# Patient Record
Sex: Male | Born: 1995 | Race: Black or African American | Hispanic: No | Marital: Single | Smoking: Current some day smoker
Health system: Southern US, Community
[De-identification: ages and names within clinical notes are randomized; demographics above are authoritative.]

---
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
## 2014-05-19 ENCOUNTER — Encounter (HOSPITAL_COMMUNITY): Payer: Self-pay | Admitting: Emergency Medicine

## 2014-05-19 ENCOUNTER — Emergency Department (INDEPENDENT_AMBULATORY_CARE_PROVIDER_SITE_OTHER)
Admission: EM | Admit: 2014-05-19 | Discharge: 2014-05-19 | Disposition: A | Discharge status: Home / Self Care | Payer: Self-pay | Source: Home / Self Care | Attending: Family Medicine | Admitting: Family Medicine

## 2014-05-19 DIAGNOSIS — S01111A Laceration without foreign body of right eyelid and periocular area, initial encounter: Secondary | ICD-10-CM

## 2014-05-19 DIAGNOSIS — S0180XA Unspecified open wound of other part of head, initial encounter: Secondary | ICD-10-CM
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
## 2014-05-19 NOTE — Discharge Instructions (Signed)
Thank you for coming in today. ° ° °Scar Minimization °You will have a scar anytime you have surgery and a cut is made in the skin or you have something removed from your skin (mole, skin cancer, cyst). Although scars are unavoidable following surgery, there are ways to minimize their appearance. °It is important to follow all the instructions you receive from your caregiver about wound care. How your wound heals will influence the appearance of your scar. If you do not follow the wound care instructions as directed, complications such as infection may occur. Wound instructions include keeping the wound clean, moist, and not letting the wound form a scab. Some people form scars that are raised and lumpy (hypertrophic) or larger than the initial wound (keloidal). °HOME CARE INSTRUCTIONS  °· Follow wound care instructions as directed. °· Keep the wound clean by washing it with soap and water. °· Keep the wound moist with provided antibiotic cream or petroleum jelly until completely healed. Moisten twice a day for about 2 weeks. °· Get stitches (sutures) taken out at the scheduled time. °· Avoid touching or manipulating your wound unless needed. Wash your hands thoroughly before and after touching your wound. °· Follow all restrictions such as limits on exercise or work. This depends on where your scar is located. °· Keep the scar protected from sunburn. Cover the scar with sunscreen/sunblock with SPF 30 or higher. °· Gently massage the scar using a circular motion to help minimize the appearance of the scar. Do this only after the wound has closed and all the sutures have been removed. °· For hypertrophic or keloidal scars, there are several ways to treat and minimize their appearance. Methods include compression therapy, intralesional corticosteroids, laser therapy, or surgery. These methods are performed by your caregiver. °Remember that the scar may appear lighter or darker than your normal skin color. This  difference in color should even out with time. °SEEK MEDICAL CARE IF:  °· You have a fever. °· You develop signs of infection such as pain, redness, pus, and warmth. °· You have questions or concerns. °Document Released: 03/09/2010 Document Revised: 12/12/2011 Document Reviewed: 03/09/2010 °ExitCare® Patient Information ©2015 [employer]. This information is not intended to replace advice given to you by your health care provider. Make sure you discuss any questions you have with your health care provider. ° °

## 2014-05-19 NOTE — ED Notes (Signed)
Pt is here to have stitches removed above right eyebrow Had them placed at another facility  Voices no new concerns Alert; no signs of acute distress.

## 2015-05-18 ENCOUNTER — Encounter (HOSPITAL_COMMUNITY): Payer: Self-pay | Admitting: Emergency Medicine

## 2015-05-18 ENCOUNTER — Emergency Department (INDEPENDENT_AMBULATORY_CARE_PROVIDER_SITE_OTHER)
Admission: EM | Admit: 2015-05-18 | Discharge: 2015-05-18 | Disposition: A | Discharge status: Home / Self Care | Payer: Self-pay | Source: Home / Self Care | Attending: Family Medicine | Admitting: Family Medicine

## 2015-05-18 DIAGNOSIS — R52 Pain, unspecified: Secondary | ICD-10-CM

## 2015-05-18 DIAGNOSIS — J3489 Other specified disorders of nose and nasal sinuses: Secondary | ICD-10-CM

## 2015-05-18 LAB — CK: Total CK: 195 U/L (ref 49–397)

## 2015-05-18 LAB — POCT I-STAT, CHEM 8
BUN: 15 mg/dL (ref 6–20)
CHLORIDE: 101 mmol/L (ref 101–111)
CREATININE: 0.9 mg/dL (ref 0.61–1.24)
Calcium, Ion: 1.25 mmol/L — ABNORMAL HIGH (ref 1.12–1.23)
GLUCOSE: 88 mg/dL (ref 65–99)
HCT: 48 % (ref 39.0–52.0)
HEMOGLOBIN: 16.3 g/dL (ref 13.0–17.0)
POTASSIUM: 3.9 mmol/L (ref 3.5–5.1)
Sodium: 139 mmol/L (ref 135–145)
TCO2: 25 mmol/L (ref 0–100)

## 2015-05-18 MED ORDER — ACETAMINOPHEN 325 MG PO TABS
ORAL_TABLET | ORAL | Status: AC
  Filled 2015-05-18: qty 3

## 2015-05-18 MED ORDER — IPRATROPIUM BROMIDE 0.06 % NA SOLN: 2.0000 | Freq: Four times a day (QID) | NASAL | Status: DC

## 2015-05-18 NOTE — ED Provider Notes (Signed)
CSN: [id number]     Arrival date & time 05/18/15  1343 History   First MD Initiated Contact with Patient 05/18/15 1550     Chief Complaint  Patient presents with  . Generalized Body Aches   (Consider location/radiation/quality/duration/timing/severity/associated sxs/prior Treatment) HPI   23:00 yesterday pt developed generalized weakness and the sensation of having a "boulder on his body." Runy nose started today. Has not taken naything for his symptoms.  No change since onset.  Nothing makes symptoms better or worse.  Pain in lower back and, R leg and shouders. Constant. Very dark urine x 24 hrs.  Works in [employer]. Patient works out for several hours every day as well. deneis cough, fever, CP, SOB, palptiations, rash.   History reviewed. No pertinent past medical history. History reviewed. No pertinent past surgical history. Family History  Problem Relation Age of Onset  . Cancer Other   . Diabetes Other   . Heart failure Other   . Hyperlipidemia Other    Social History  Substance Use Topics  . Smoking status: Never Smoker   . Smokeless tobacco: None  . Alcohol Use: No    Review of Systems Per HPI with all other pertinent systems negative.   Allergies  Review of patient's allergies indicates no known allergies.  Home Medications   Prior to Admission medications   Not on File   BP 137/76 mmHg  Pulse 67  Temp(Src) 97.9 F (36.6 C) (Oral)  Resp 16  SpO2 100% Physical Exam Physical Exam  Constitutional: oriented to person, place, and time. appears well-developed and well-nourished. No distress.  HENT:  Head: Normocephalic and atraumatic.  Eyes: EOMI. PERRL.  Neck: Normal range of motion.  Cardiovascular: RRR, no m/r/g, 2+ distal pulses,  Pulmonary/Chest: Effort normal and breath sounds normal. No respiratory distress.  Abdominal: Soft. Bowel sounds are normal. NonTTP, no distension.  Musculoskeletal: Normal range of motion. Non ttp, no effusion.   Neurological: alert and oriented to person, place, and time.  Skin: Skin is warm. No rash noted. non diaphoretic.  Psychiatric: normal mood and affect. behavior is normal. Judgment and thought content normal.   ED Course  Procedures (including critical care time) Labs Review Labs Reviewed  POCT I-STAT, CHEM 8 - Abnormal; Notable for the following:    Calcium, Ion 1.25 (*)    All other components within normal limits  CK    Imaging Review No results found.   MDM  No diagnosis found.  CHem 8 w/ NMl Cr. CK 195. Low concern for rhabdo at this point time. Likely viral etiology. Patient to stay well-hydrated and get plain rest. Tylenol and ibuprofen as needed for pain relief. Due to the ED if gets worse. Gatorade adn tylenol  po in office.   Ozella Rocks, MD 05/18/15 [phone]

## 2015-05-18 NOTE — ED Notes (Signed)
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 No signs of acute distress.

## 2015-05-18 NOTE — ED Notes (Signed)
Call back number verified.  

## 2015-05-18 NOTE — Discharge Instructions (Signed)
At this point time it appears that your symptoms are primarily due to a viral illness compounded by overexertion and dehydration. Please stay well-hydrated and get plenty of rest over the next couple of days. Your condition may continue to worsen for the next 1-3 days before improving. We have sent out additional lab work and we'll call you if it is abnormal at which point you'll have to go to the emergency room for more treatment. Please skip the rest but remain active and stay well-hydrated.

## 2017-05-08 ENCOUNTER — Ambulatory Visit (HOSPITAL_COMMUNITY)
Admission: EM | Admit: 2017-05-08 | Discharge: 2017-05-08 | Disposition: A | Discharge status: Home / Self Care | Payer: Self-pay | Attending: Internal Medicine | Admitting: Internal Medicine

## 2017-05-08 ENCOUNTER — Ambulatory Visit (INDEPENDENT_AMBULATORY_CARE_PROVIDER_SITE_OTHER): Payer: Self-pay

## 2017-05-08 ENCOUNTER — Encounter (HOSPITAL_COMMUNITY): Payer: Self-pay | Admitting: Nurse Practitioner

## 2017-05-08 DIAGNOSIS — M659 Synovitis and tenosynovitis, unspecified: Secondary | ICD-10-CM

## 2017-05-08 MED ORDER — MELOXICAM 15 MG PO TABS: 15.0000 mg | ORAL_TABLET | Freq: Every day | ORAL | 2 refills | Status: DC

## 2017-05-08 NOTE — ED Triage Notes (Addendum)
Pt presents with left wrist pain. The pain began about three weeks ago and has been increasingly worse since onset. The pain is present with movement and absent at rest. 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 He has tried ice and wrist splint with some relief.

## 2017-05-08 NOTE — ED Provider Notes (Signed)
  [facility]   [id number] 05/08/17 Arrival Time: 1314  ASSESSMENT & PLAN:  1. Tenosynovitis, wrist     Meds ordered this encounter  Medications  . meloxicam (MOBIC) 15 MG tablet    Sig: Take 1 tablet (15 mg total) by mouth daily.    Dispense:  30 tablet    Refill:  2    Order Specific Question:   Supervising Provider    Answer:   Eustace MooreMURRAY, LAURA W [[id number]]    Reviewed expectations re: course of current medical issues. Questions answered. Outlined signs and symptoms indicating need for more acute intervention. Patient verbalized understanding. After Visit Summary given.   SUBJECTIVE:  Cody Potter is a 21 y.o. male who presents with complaint of Left wrist pain. He has no traumatic history, he has not fallen, has not had any MVC's. 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 No fever or chills or swelling of the joint  ROS: As per HPI, remainder of ROS negative.   OBJECTIVE:  Vitals:   05/08/17 1406  BP: 124/71  Pulse: 84  Resp: 16  Temp: 98.4 F (36.9 C)  TempSrc: Oral  SpO2: 99%     General appearance: alert; no distress HEENT: normocephalic; atraumatic; conjunctivae normal;  Lungs: clear to auscultation bilaterally Heart: regular rate and rhythm Abdomen: soft, non-tender; bowel sounds normal; no masses or organomegaly; no guarding or rebound tenderness Back: no CVA tenderness Extremities: no cyanosis or edema; symmetrical with no gross Deformities, left hand no sensory deficit at the ulnar, radial, or medial nerve, capillary refills less than 2 seconds, able to flex and extend the fingers, full grip strength, there is tenderness at the distal head of the ulna, negative Finkelstein.  Skin: warm and dry Neurologic: Grossly normal Psychological:  alert and cooperative; normal mood and  affect  Procedures:     Labs Reviewed - No data to display  Dg Wrist Complete Left  Result Date: 05/08/2017 CLINICAL DATA:  21 y/o  M; three weeks of left wrist pain. EXAM: LEFT WRIST - COMPLETE 3+ VIEW COMPARISON:  None. FINDINGS: There is no evidence of fracture or dislocation. There is no evidence of arthropathy or other focal bone abnormality. Soft tissues are unremarkable. IMPRESSION: Negative. Electronically Signed   By: Mitzi HansenLance  Furusawa-Stratton M.D.   On: 05/08/2017 15:00    No Known Allergies  PMHx, SurgHx, SocialHx, Medications, and Allergies were reviewed in the Visit Navigator and updated as appropriate.       Dorena BodoKennard, Simya Tercero, NP 05/08/17 [phone]

## 2017-05-08 NOTE — Discharge Instructions (Signed)
Rest, ice 15 minutes at a time for more times a day, meloxicam once daily for pain, do not take any Motrin, ibuprofen, naproxen with this medication, but you may take Tylenol with it. If pain persists past one month, follow-up with an orthopedist

## 2017-05-28 ENCOUNTER — Encounter (HOSPITAL_COMMUNITY): Payer: Self-pay | Admitting: Emergency Medicine

## 2017-05-28 DIAGNOSIS — Y998 Other external cause status: Secondary | ICD-10-CM | POA: Insufficient documentation

## 2017-05-28 DIAGNOSIS — S161XXA Strain of muscle, fascia and tendon at neck level, initial encounter: Secondary | ICD-10-CM | POA: Insufficient documentation

## 2017-05-28 DIAGNOSIS — Y929 Unspecified place or not applicable: Secondary | ICD-10-CM | POA: Insufficient documentation

## 2017-05-28 DIAGNOSIS — Y9389 Activity, other specified: Secondary | ICD-10-CM | POA: Insufficient documentation
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
## 2017-05-29 ENCOUNTER — Emergency Department (HOSPITAL_COMMUNITY)
Admission: EM | Admit: 2017-05-29 | Discharge: 2017-05-29 | Disposition: A | Discharge status: Home / Self Care | Payer: Self-pay | Attending: Emergency Medicine | Admitting: Emergency Medicine

## 2017-05-29 ENCOUNTER — Emergency Department (HOSPITAL_COMMUNITY): Payer: Self-pay

## 2017-05-29 DIAGNOSIS — S161XXA Strain of muscle, fascia and tendon at neck level, initial encounter: Secondary | ICD-10-CM

## 2017-05-29 MED ORDER — ACETAMINOPHEN 325 MG PO TABS
650.0000 mg | ORAL_TABLET | Freq: Once | ORAL | Status: AC
  Administered 2017-05-29: 650 mg via ORAL
  Filled 2017-05-29: qty 2

## 2017-05-29 MED ORDER — METHOCARBAMOL 500 MG PO TABS: 500.0000 mg | ORAL_TABLET | Freq: Three times a day (TID) | ORAL | 0 refills | Status: DC | PRN

## 2017-05-29 MED ORDER — IBUPROFEN 200 MG PO TABS
600.0000 mg | ORAL_TABLET | Freq: Once | ORAL | Status: AC
  Administered 2017-05-29: 600 mg via ORAL
  Filled 2017-05-29: qty 3

## 2017-05-29 MED ORDER — CYCLOBENZAPRINE HCL 10 MG PO TABS
5.0000 mg | ORAL_TABLET | Freq: Once | ORAL | Status: AC
  Administered 2017-05-29: 5 mg via ORAL
  Filled 2017-05-29: qty 1

## 2017-05-29 MED ORDER — IBUPROFEN 600 MG PO TABS: 600.0000 mg | ORAL_TABLET | Freq: Three times a day (TID) | ORAL | 0 refills | Status: DC | PRN
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
## 2017-06-06 ENCOUNTER — Ambulatory Visit (HOSPITAL_COMMUNITY)
Admission: EM | Admit: 2017-06-06 | Discharge: 2017-06-06 | Disposition: A | Discharge status: Home / Self Care | Payer: Self-pay | Attending: Family Medicine | Admitting: Family Medicine

## 2017-06-06 ENCOUNTER — Encounter (HOSPITAL_COMMUNITY): Payer: Self-pay | Admitting: Emergency Medicine

## 2017-06-06 DIAGNOSIS — S161XXA Strain of muscle, fascia and tendon at neck level, initial encounter: Secondary | ICD-10-CM

## 2017-06-06 MED ORDER — PREDNISONE 10 MG (21) PO TBPK: ORAL_TABLET | ORAL | 0 refills | Status: DC
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
## 2017-06-13 ENCOUNTER — Encounter (HOSPITAL_COMMUNITY): Payer: Self-pay | Admitting: Emergency Medicine

## 2017-06-13 ENCOUNTER — Ambulatory Visit (HOSPITAL_COMMUNITY)
Admission: EM | Admit: 2017-06-13 | Discharge: 2017-06-13 | Disposition: A | Discharge status: Home / Self Care | Payer: Self-pay | Attending: Family Medicine | Admitting: Family Medicine

## 2017-06-13 DIAGNOSIS — S161XXD Strain of muscle, fascia and tendon at neck level, subsequent encounter: Secondary | ICD-10-CM

## 2017-06-13 MED ORDER — PREDNISONE 20 MG PO TABS: ORAL_TABLET | ORAL | 0 refills | Status: DC

## 2017-06-13 MED ORDER — METHOCARBAMOL 500 MG PO TABS: 500.0000 mg | ORAL_TABLET | Freq: Three times a day (TID) | ORAL | 0 refills | Status: DC | PRN
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
## 2017-06-13 NOTE — Discharge Instructions (Signed)
If your pain persists, you will need a referral for physical therapy

## 2017-08-30 ENCOUNTER — Ambulatory Visit (HOSPITAL_COMMUNITY)
Admission: EM | Admit: 2017-08-30 | Discharge: 2017-08-30 | Disposition: A | Discharge status: Home / Self Care | Payer: Self-pay | Attending: Family Medicine | Admitting: Family Medicine

## 2017-08-30 ENCOUNTER — Other Ambulatory Visit: Payer: Self-pay

## 2017-08-30 ENCOUNTER — Encounter (HOSPITAL_COMMUNITY): Payer: Self-pay | Admitting: Emergency Medicine

## 2017-08-30 DIAGNOSIS — J069 Acute upper respiratory infection, unspecified: Secondary | ICD-10-CM

## 2017-08-30 NOTE — ED Triage Notes (Signed)
Onset yesterday morning.  Headache, nauseated.  Reports coughing up streaks of blood in phlegm.  Patient has an itchy sore throat and cough

## 2017-08-30 NOTE — Discharge Instructions (Signed)
Push fluids to ensure adequate hydration and keep secretions thin.  Tylenol and/or ibuprofen as needed for pain or fevers.  Over the counter medications as needed for symptoms, cough syrups, throat lozenges, mucinex, etc.  If symptoms worsen or do not improve in the next week to return to be seen or to follow up with PCP.

## 2017-08-30 NOTE — ED Provider Notes (Signed)
[facility]    CSN: [id number] Arrival date & time: 08/30/17  1224     History   Chief Complaint Chief Complaint  Patient presents with  . URI    HPI Cody Potter is a 21 y.o. male.   Cody Potter presents with complaints of headache, sore throat, cough which started suddenly yesterday. States yesterday he vomited multiple times and noted blood to his cough production yesterday. He states he generally feels improved today, without vomiting or blood today. Without fevers. Has been drinking liquids and urinating. Decreased food intake. Without diarrhea, denies abdominal pain, back pain. No known ill contacts. Has not taken any medications for his symptoms. Had to leave work early yesterday.    ROS per HPI.       History reviewed. No pertinent past medical history.  There are no active problems to display for this patient.   History reviewed. No pertinent surgical history.     Home Medications    Prior to Admission medications   Not on File    Family History Family History  Problem Relation Age of Onset  . Cancer Other   . Diabetes Other   . Heart failure Other   . Hyperlipidemia Other     Social History Social History   Tobacco Use  . Smoking status: Never Smoker  . Smokeless tobacco: Never Used  Substance Use Topics  . Alcohol use: No  . Drug use: No     Allergies   Patient has no known allergies.   Review of Systems Review of Systems   Physical Exam Triage Vital Signs ED Triage Vitals  Enc Vitals Group     BP 08/30/17 1239 126/73     Pulse Rate 08/30/17 1239 75     Resp 08/30/17 1239 18     Temp 08/30/17 1239 98 F (36.7 C)     Temp Source 08/30/17 1239 Oral     SpO2 08/30/17 1239 98 %     Weight --      Height --      Head Circumference --      Peak Flow --      Pain Score 08/30/17 1237 4     Pain Loc --      Pain Edu? --      Excl. in GC? --    No data found.  Updated Vital Signs BP 126/73 (BP Location: Left  Arm)   Pulse 75   Temp 98 F (36.7 C) (Oral)   Resp 18   SpO2 98%   Visual Acuity Right Eye Distance:   Left Eye Distance:   Bilateral Distance:    Right Eye Near:   Left Eye Near:    Bilateral Near:     Physical Exam  Constitutional: He is oriented to person, place, and time. He appears well-developed and well-nourished.  HENT:  Head: Normocephalic and atraumatic. Head is without raccoon's eyes.  Right Ear: Tympanic membrane, external ear and ear canal normal.  Left Ear: Tympanic membrane, external ear and ear canal normal.  Nose: Nose normal. Right sinus exhibits no maxillary sinus tenderness and no frontal sinus tenderness. Left sinus exhibits no maxillary sinus tenderness and no frontal sinus tenderness.  Mouth/Throat: Uvula is midline and mucous membranes are normal. Posterior oropharyngeal erythema present.  Eyes: Conjunctivae are normal. Pupils are equal, round, and reactive to light.  Neck: Normal range of motion.  Cardiovascular: Normal rate and regular rhythm.  Pulmonary/Chest: Effort normal and breath sounds normal.  Abdominal: Soft. There is no tenderness.  Lymphadenopathy:    He has no cervical adenopathy.  Neurological: He is alert and oriented to person, place, and time.  Skin: Skin is warm and dry.  Vitals reviewed.    UC Treatments / Results  Labs (all labs ordered are listed, but only abnormal results are displayed) Labs Reviewed - No data to display  EKG  EKG Interpretation None       Radiology No results found.  Procedures Procedures (including critical care time)  Medications Ordered in UC Medications - No data to display   Initial Impression / Assessment and Plan / UC Course  I have reviewed the triage vital signs and the nursing notes.  Pertinent labs & imaging results that were available during my care of the patient were reviewed by me and considered in my medical decision making (see chart for details).     Benign physical  findings on exam. Non toxic non distressed in appearance, vitals stable at this time. Symptoms generally improving today. Likely viral in nature. Continue with supportive cares. Tylenol and/or ibuprofen as needed for pain or fevers. Push fluids to ensure adequate hydration and keep secretions thin. Push fluids to ensure adequate hydration and keep secretions thin. Patient verbalized understanding and agreeable to plan.    Final Clinical Impressions(s) / UC Diagnoses   Final diagnoses:  Upper respiratory tract infection, unspecified type    ED Discharge Orders    None       Controlled Substance Prescriptions [place] Controlled Substance Registry consulted? Not Applicable   Georgetta HaberBurky, Natalie B, NP 08/30/17 1305

## 2018-02-12 ENCOUNTER — Ambulatory Visit (HOSPITAL_COMMUNITY)
Admission: EM | Admit: 2018-02-12 | Discharge: 2018-02-12 | Disposition: A | Discharge status: Home / Self Care | Payer: Self-pay

## 2018-03-12 ENCOUNTER — Ambulatory Visit (HOSPITAL_COMMUNITY)
Admission: EM | Admit: 2018-03-12 | Discharge: 2018-03-12 | Disposition: A | Discharge status: Home / Self Care | Payer: Self-pay | Attending: Family Medicine | Admitting: Family Medicine

## 2018-03-12 ENCOUNTER — Encounter (HOSPITAL_COMMUNITY): Payer: Self-pay | Admitting: Emergency Medicine

## 2018-03-12 DIAGNOSIS — H5713 Ocular pain, bilateral: Secondary | ICD-10-CM

## 2018-03-12 DIAGNOSIS — H16133 Photokeratitis, bilateral: Secondary | ICD-10-CM

## 2018-03-12 MED ORDER — SULFACETAMIDE-PREDNISOLONE 10-0.2 % OP OINT: 1.0000 "application " | TOPICAL_OINTMENT | Freq: Four times a day (QID) | OPHTHALMIC | 0 refills | Status: DC

## 2018-03-12 MED ORDER — OXYCODONE-ACETAMINOPHEN 5-325 MG PO TABS: 2.0000 | ORAL_TABLET | ORAL | 0 refills | Status: DC | PRN

## 2018-03-12 MED ORDER — TETRACAINE HCL 0.5 % OP SOLN
2.0000 [drp] | Freq: Once | OPHTHALMIC | Status: AC
  Administered 2018-03-12: 2 [drp] via OPHTHALMIC

## 2018-03-12 NOTE — Discharge Instructions (Addendum)
Use the eye ointment as directed Take the pain medicine as needed Get your eyes checked again if not better in 2 days Off work 2 days
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
## 2018-03-12 NOTE — ED Triage Notes (Signed)
Pt c/o bilateral redness, drainage, burning in bilateral eyes.
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
## 2018-04-17 ENCOUNTER — Other Ambulatory Visit: Payer: Self-pay

## 2018-04-17 ENCOUNTER — Encounter (HOSPITAL_COMMUNITY): Payer: Self-pay

## 2018-04-17 ENCOUNTER — Ambulatory Visit (HOSPITAL_COMMUNITY)
Admission: EM | Admit: 2018-04-17 | Discharge: 2018-04-17 | Disposition: A | Discharge status: Home / Self Care | Payer: Self-pay | Attending: Family Medicine | Admitting: Family Medicine

## 2018-04-17 DIAGNOSIS — R197 Diarrhea, unspecified: Secondary | ICD-10-CM

## 2018-04-17 DIAGNOSIS — R112 Nausea with vomiting, unspecified: Secondary | ICD-10-CM

## 2018-04-17 MED ORDER — DICYCLOMINE HCL 20 MG PO TABS: 20.0000 mg | ORAL_TABLET | Freq: Two times a day (BID) | ORAL | 0 refills | Status: DC

## 2018-04-17 MED ORDER — ONDANSETRON 4 MG PO TBDP: 4.0000 mg | ORAL_TABLET | Freq: Three times a day (TID) | ORAL | 0 refills | Status: DC | PRN

## 2018-04-17 NOTE — ED Provider Notes (Signed)
[facility]    CSN: [id number] Arrival date & time: 04/17/18  1726     History   Chief Complaint Chief Complaint  Patient presents with  . Poisoning    HPI Cody Potter is a 22 y.o. male.   22 year old male comes in for 2-day history of nausea, vomiting, diarrhea.  States possibly ate something at work that caused the symptoms.  States yesterday had 10+ episodes of nonbilious nonbloody vomit, and very frequent episodes of diarrhea.  Denies hematochezia, melena.  States has had abdominal cramping that seems to move in location, and sometimes generalized.  Mild improvement after bowel movement, no changes after vomiting.  Denies fever, chills, night sweats.  Denies URI symptoms such as cough, congestion, sore throat.  Denies recent antibiotic use, travel.  States symptoms has been slowing down today, has had 5-6 episodes of diarrhea, and 6+ episodes of vomiting.  Has been able to keep fluids down sometimes, but has not tried food.  He has been taking Tylenol for his symptoms.     History reviewed. No pertinent past medical history.  There are no active problems to display for this patient.   History reviewed. No pertinent surgical history.     Home Medications    Prior to Admission medications   Medication Sig Start Date End Date Taking? Authorizing Provider  dicyclomine (BENTYL) 20 MG tablet Take 1 tablet (20 mg total) by mouth 2 (two) times daily. 04/17/18   Cathie HoopsYu, Stina Gane V, PA-C  ondansetron (ZOFRAN ODT) 4 MG disintegrating tablet Take 1 tablet (4 mg total) by mouth every 8 (eight) hours as needed for nausea or vomiting. 04/17/18   Cathie HoopsYu, Leonor Darnell V, PA-C  oxyCODONE-acetaminophen (PERCOCET/ROXICET) 5-325 MG tablet Take 2 tablets by mouth every 4 (four) hours as needed for severe pain. 03/12/18   Eustace MooreNelson, Yvonne Sue, MD  sulfacetaminde-prednisoLONE [facility] ophthalmic ointment Place 1 application into both eyes 4 (four) times daily. 03/12/18   Eustace MooreNelson, Yvonne Sue, MD     Family History Family History  Problem Relation Age of Onset  . Cancer Other   . Diabetes Other   . Heart failure Other   . Hyperlipidemia Other     Social History Social History   Tobacco Use  . Smoking status: Never Smoker  . Smokeless tobacco: Never Used  Substance Use Topics  . Alcohol use: No  . Drug use: No     Allergies   Patient has no known allergies.   Review of Systems Review of Systems  Reason unable to perform ROS: See HPI as above.     Physical Exam Triage Vital Signs ED Triage Vitals  Enc Vitals Group     BP 04/17/18 1736 128/78     Pulse Rate 04/17/18 1736 76     Resp 04/17/18 1736 16     Temp 04/17/18 1736 97.9 F (36.6 C)     Temp Source 04/17/18 1736 Oral     SpO2 04/17/18 1736 97 %     Weight --      Height --      Head Circumference --      Peak Flow --      Pain Score 04/17/18 1739 5     Pain Loc --      Pain Edu? --      Excl. in GC? --    No data found.  Updated Vital Signs BP 128/78 (BP Location: Left Arm)   Pulse 76   Temp 97.9 F (  36.6 C) (Oral)   Resp 16   SpO2 97%   Physical Exam  Constitutional: He is oriented to person, place, and time. He appears well-developed and well-nourished. No distress.  HENT:  Head: Normocephalic and atraumatic.  Cardiovascular: Normal rate, regular rhythm and normal heart sounds. Exam reveals no gallop and no friction rub.  No murmur heard. Pulmonary/Chest: Effort normal and breath sounds normal. He has no wheezes. He has no rales.  Abdominal: Soft. He exhibits no mass. Bowel sounds are increased. There is no tenderness. There is no rigidity, no rebound, no guarding and no CVA tenderness.  Neurological: He is alert and oriented to person, place, and time.  Skin: Skin is warm and dry.  Psychiatric: He has a normal mood and affect. His behavior is normal. Judgment normal.   UC Treatments / Results  Labs (all labs ordered are listed, but only abnormal results are displayed) Labs  Reviewed - No data to display  EKG None  Radiology No results found.  Procedures Procedures (including critical care time)  Medications Ordered in UC Medications - No data to display  Initial Impression / Assessment and Plan / UC Course  I have reviewed the triage vital signs and the nursing notes.  Pertinent labs & imaging results that were available during my care of the patient were reviewed by me and considered in my medical decision making (see chart for details).    Discussed with patient no alarming signs on exam. Zofran for nausea. Bentyl for abdominal cramping. Push fluids. Bland diet, advance as tolerated. Return precautions given.  Final Clinical Impressions(s) / UC Diagnoses   Final diagnoses:  Nausea vomiting and diarrhea    ED Prescriptions    Medication Sig Dispense Auth. Provider   ondansetron (ZOFRAN ODT) 4 MG disintegrating tablet Take 1 tablet (4 mg total) by mouth every 8 (eight) hours as needed for nausea or vomiting. 20 tablet Noreene Boreman V, PA-C   dicyclomine (BENTYL) 20 MG tablet Take 1 tablet (20 mg total) by mouth 2 (two) times daily. 20 tablet Threasa Alpha, [place] 04/17/18 [phone]

## 2018-04-17 NOTE — ED Triage Notes (Signed)
Pt presents to [facility] for possible food poisoning since yesterday, pt complains of diarrhea, vomiting, and fatigue. Pt has taken Tylenol to treat symptoms

## 2018-04-17 NOTE — ED Notes (Signed)
Pt discharged by provider.

## 2018-04-17 NOTE — Discharge Instructions (Signed)
Zofran for nausea and vomiting as needed. Bentyl for stomach cramping. Keep hydrated, you urine should be clear to pale yellow in color. Bland diet, advance as tolerated. Monitor for any worsening of symptoms, nausea or vomiting not controlled by medication, worsening abdominal pain, fever, follow-up for reevaluation. ° °

## 2019-07-30 ENCOUNTER — Encounter (HOSPITAL_COMMUNITY): Payer: Self-pay

## 2019-07-30 ENCOUNTER — Other Ambulatory Visit: Payer: Self-pay

## 2019-07-30 ENCOUNTER — Ambulatory Visit (HOSPITAL_COMMUNITY)
Admission: EM | Admit: 2019-07-30 | Discharge: 2019-07-30 | Disposition: A | Discharge status: Home / Self Care | Payer: Self-pay | Attending: Emergency Medicine | Admitting: Emergency Medicine

## 2019-07-30 DIAGNOSIS — K529 Noninfective gastroenteritis and colitis, unspecified: Secondary | ICD-10-CM

## 2019-07-30 MED ORDER — ONDANSETRON 4 MG PO TBDP: 4.0000 mg | ORAL_TABLET | Freq: Three times a day (TID) | ORAL | 0 refills | Status: DC | PRN

## 2019-07-30 NOTE — Discharge Instructions (Signed)
Take the prescribed Zofran as needed for nausea/vomiting.    Rest and drink clear liquids.    Return here or follow-up with your primary care provider if you are unable to stay hydrated with clear liquids.

## 2019-07-30 NOTE — ED Provider Notes (Signed)
[facility]    CSN: [id number] Arrival date & time: 07/30/19  1431      History   Chief Complaint Chief Complaint  Patient presents with  . Abdominal Pain    HPI Cody Potter is a 23 y.o. male.   Patient presents with 1 day history of nausea, vomiting, diarrhea.  He request a work note as he missed work yesterday and today.  He reports one episode of vomiting this morning and one episode of diarrhea today.  He reports ongoing nausea.  He declines antinausea medication.  He denies fever, chills, cough, shortness of breath, abdominal pain, dysuria, back pain, or other symptoms.  No treatments attempted at home.  No pertinent medical history.  The history is provided by the patient.    History reviewed. No pertinent past medical history.  There are no active problems to display for this patient.   History reviewed. No pertinent surgical history.     Home Medications    Prior to Admission medications   Medication Sig Start Date End Date Taking? Authorizing Provider  dicyclomine (BENTYL) 20 MG tablet Take 1 tablet (20 mg total) by mouth 2 (two) times daily. 04/17/18   Cathie Hoops, Amy V, PA-C  ondansetron (ZOFRAN ODT) 4 MG disintegrating tablet Take 1 tablet (4 mg total) by mouth every 8 (eight) hours as needed for nausea or vomiting. 07/30/19   Mickie Bail, NP  oxyCODONE-acetaminophen (PERCOCET/ROXICET) 5-325 MG tablet Take 2 tablets by mouth every 4 (four) hours as needed for severe pain. 03/12/18   Eustace Moore, MD  sulfacetaminde-prednisoLONE [facility] ophthalmic ointment Place 1 application into both eyes 4 (four) times daily. 03/12/18   Eustace Moore, MD    Family History Family History  Problem Relation Age of Onset  . Cancer Other   . Diabetes Other   . Heart failure Other   . Hyperlipidemia Other     Social History Social History   Tobacco Use  . Smoking status: Never Smoker  . Smokeless tobacco: Never Used  Substance Use Topics   . Alcohol use: No  . Drug use: No     Allergies   Patient has no known allergies.   Review of Systems Review of Systems  Constitutional: Negative for chills and fever.  HENT: Negative for ear pain and sore throat.   Eyes: Negative for pain and visual disturbance.  Respiratory: Negative for cough and shortness of breath.   Cardiovascular: Negative for chest pain and palpitations.  Gastrointestinal: Positive for diarrhea, nausea and vomiting. Negative for abdominal pain.  Genitourinary: Negative for dysuria and hematuria.  Musculoskeletal: Negative for arthralgias and back pain.  Skin: Negative for color change and rash.  Neurological: Negative for seizures and syncope.  All other systems reviewed and are negative.    Physical Exam Triage Vital Signs ED Triage Vitals  Enc Vitals Group     BP 07/30/19 1604 139/88     Pulse Rate 07/30/19 1604 90     Resp 07/30/19 1604 18     Temp 07/30/19 1604 99 F (37.2 C)     Temp Source 07/30/19 1604 Oral     SpO2 07/30/19 1604 100 %     Weight 07/30/19 1603 180 lb (81.6 kg)     Height --      Head Circumference --      Peak Flow --      Pain Score 07/30/19 1602 2     Pain Loc --  Pain Edu? --      Excl. in GC? --    No data found.  Updated Vital Signs BP 139/88 (BP Location: Right Arm)   Pulse 90   Temp 99 F (37.2 C) (Oral)   Resp 18   Wt 180 lb (81.6 kg)   SpO2 100%   BMI 21.91 kg/m   Visual Acuity Right Eye Distance:   Left Eye Distance:   Bilateral Distance:    Right Eye Near:   Left Eye Near:    Bilateral Near:     Physical Exam Vitals signs and nursing note reviewed.  Constitutional:      Appearance: He is well-developed.  HENT:     Head: Normocephalic and atraumatic.     Mouth/Throat:     Mouth: Mucous membranes are moist.     Pharynx: Oropharynx is clear.  Eyes:     Conjunctiva/sclera: Conjunctivae normal.  Neck:     Musculoskeletal: Neck supple.  Cardiovascular:     Rate and Rhythm:  Normal rate and regular rhythm.     Heart sounds: No murmur.  Pulmonary:     Effort: Pulmonary effort is normal. No respiratory distress.     Breath sounds: Normal breath sounds.  Abdominal:     General: Bowel sounds are normal.     Palpations: Abdomen is soft.     Tenderness: There is no abdominal tenderness. There is no right CVA tenderness, left CVA tenderness, guarding or rebound.  Skin:    General: Skin is warm and dry.     Findings: No rash.  Neurological:     General: No focal deficit present.     Mental Status: He is alert and oriented to person, place, and time.  Psychiatric:        Mood and Affect: Mood normal.        Behavior: Behavior normal.      UC Treatments / Results  Labs (all labs ordered are listed, but only abnormal results are displayed) Labs Reviewed - No data to display  EKG   Radiology No results found.  Procedures Procedures (including critical care time)  Medications Ordered in UC Medications - No data to display  Initial Impression / Assessment and Plan / UC Course  I have reviewed the triage vital signs and the nursing notes.  Pertinent labs & imaging results that were available during my care of the patient were reviewed by me and considered in my medical decision making (see chart for details).    Gastroenteritis.  Treating with Zofran.  Discussed with patient that he should rest and drink clear liquids such as Sprite or ginger ale to stay hydrated.  Discussed that he should return here or follow-up with his PCP if he is unable to keep himself hydrated.  Patient agrees to plan of care.     Final Clinical Impressions(s) / UC Diagnoses   Final diagnoses:  Gastroenteritis     Discharge Instructions     Take the prescribed Zofran as needed for nausea/vomiting.    Rest and drink clear liquids.    Return here or follow-up with your primary care provider if you are unable to stay hydrated with clear liquids.        ED  Prescriptions    Medication Sig Dispense Auth. Provider   ondansetron (ZOFRAN ODT) 4 MG disintegrating tablet Take 1 tablet (4 mg total) by mouth every 8 (eight) hours as needed for nausea or vomiting. 20 tablet Mickie Bailate, Trayshawn Durkin H, NP  PDMP not reviewed this encounter.   Sharion Balloon, NP 07/30/19 [phone]

## 2019-07-30 NOTE — ED Triage Notes (Signed)
Pt states he has diarrhea and nausea this started yesterday.  Pt states he needs work note.

## 2019-12-09 ENCOUNTER — Ambulatory Visit (HOSPITAL_COMMUNITY)
Admission: EM | Admit: 2019-12-09 | Discharge: 2019-12-09 | Disposition: A | Discharge status: Home / Self Care | Payer: Self-pay | Attending: Family Medicine | Admitting: Family Medicine

## 2019-12-09 ENCOUNTER — Encounter (HOSPITAL_COMMUNITY): Payer: Self-pay | Admitting: Emergency Medicine

## 2019-12-09 ENCOUNTER — Other Ambulatory Visit: Payer: Self-pay

## 2019-12-09 ENCOUNTER — Ambulatory Visit (INDEPENDENT_AMBULATORY_CARE_PROVIDER_SITE_OTHER): Payer: Self-pay

## 2019-12-09 DIAGNOSIS — S6991XA Unspecified injury of right wrist, hand and finger(s), initial encounter: Secondary | ICD-10-CM

## 2019-12-09 DIAGNOSIS — M79641 Pain in right hand: Secondary | ICD-10-CM

## 2019-12-09 NOTE — ED Triage Notes (Signed)
Pt sts right hand pain after slamming in door this am per pt; swelling noted across knuckles

## 2019-12-09 NOTE — Discharge Instructions (Signed)
Your x-ray was normal.  You can keep doing ice to the hand as you have been.  Ibuprofen as needed Follow up as needed for continued or worsening symptoms

## 2019-12-10 NOTE — ED Provider Notes (Signed)
[facility]    CSN: [id number] Arrival date & time: 12/09/19  1447      History   Chief Complaint Chief Complaint  Patient presents with  . Hand Injury    HPI Cody Potter is a 24 y.o. male.   Pt is a 24 year old male that presents with right hand injury. Reports earlier today he slammed his hand in a door. He has swelling and pain to right 3rd, 4th and 5th metacarpals. Good ROM. No numbness, tingling. Took ibuprofen and iced PTA.   ROS per HPI       History reviewed. No pertinent past medical history.  There are no problems to display for this patient.   History reviewed. No pertinent surgical history.     Home Medications    Prior to Admission medications   Medication Sig Start Date End Date Taking? Authorizing Provider  dicyclomine (BENTYL) 20 MG tablet Take 1 tablet (20 mg total) by mouth 2 (two) times daily. Patient not taking: Reported on 12/09/2019 04/17/18 12/09/19  Lurline Idol    Family History Family History  Problem Relation Age of Onset  . Cancer Other   . Diabetes Other   . Heart failure Other   . Hyperlipidemia Other     Social History Social History   Tobacco Use  . Smoking status: Never Smoker  . Smokeless tobacco: Never Used  Substance Use Topics  . Alcohol use: No  . Drug use: No     Allergies   Patient has no known allergies.   Review of Systems Review of Systems   Physical Exam Triage Vital Signs ED Triage Vitals [12/09/19 1511]  Enc Vitals Group     BP 135/86     Pulse Rate 99     Resp 18     Temp 98.7 F (37.1 C)     Temp Source Oral     SpO2 97 %     Weight      Height      Head Circumference      Peak Flow      Pain Score 5     Pain Loc      Pain Edu?      Excl. in GC?    No data found.  Updated Vital Signs BP 135/86 (BP Location: Right Arm)   Pulse 99   Temp 98.7 F (37.1 C) (Oral)   Resp 18   SpO2 97%   Visual Acuity Right Eye Distance:   Left Eye Distance:   Bilateral  Distance:    Right Eye Near:   Left Eye Near:    Bilateral Near:     Physical Exam Vitals and nursing note reviewed.  Constitutional:      Appearance: Normal appearance.  HENT:     Head: Normocephalic and atraumatic.     Nose: Nose normal.  Eyes:     Conjunctiva/sclera: Conjunctivae normal.  Pulmonary:     Effort: Pulmonary effort is normal.  Musculoskeletal:        General: Normal range of motion.     Right hand: Swelling, tenderness and bony tenderness present.     Cervical back: Normal range of motion.  Skin:    General: Skin is warm and dry.  Neurological:     Mental Status: He is alert.  Psychiatric:        Mood and Affect: Mood normal.      UC Treatments / Results  Labs (all labs  ordered are listed, but only abnormal results are displayed) Labs Reviewed - No data to display  EKG   Radiology DG Hand Complete Right  Result Date: 12/09/2019 CLINICAL DATA:  Slammed hand in door EXAM: RIGHT HAND - COMPLETE 3+ VIEW COMPARISON:  None. FINDINGS: No evidence of fracture of the carpal or metacarpal bones. Radiocarpal joint is intact. Phalanges are normal. No soft tissue injury. IMPRESSION: No fracture or dislocation. Electronically Signed   By: Suzy Bouchard M.D.   On: 12/09/2019 15:34    Procedures Procedures (including critical care time)  Medications Ordered in UC Medications - No data to display  Initial Impression / Assessment and Plan / UC Course  I have reviewed the triage vital signs and the nursing notes.  Pertinent labs & imaging results that were available during my care of the patient were reviewed by me and considered in my medical decision making (see chart for details).     Hand injury- x ray negative for any acute fracture.  Most likely soft tissue swelling.  RICE Ibuprofen as needed  Follow up as needed for continued or worsening symptoms  Final Clinical Impressions(s) / UC Diagnoses   Final diagnoses:  Hand injury, right, initial  encounter     Discharge Instructions     Your x-ray was normal.  You can keep doing ice to the hand as you have been.  Ibuprofen as needed Follow up as needed for continued or worsening symptoms     ED Prescriptions    None     PDMP not reviewed this encounter.   Loura Halt A, NP 12/10/19 [phone]

## 2023-03-10 ENCOUNTER — Ambulatory Visit
Admission: EM | Admit: 2023-03-10 | Discharge: 2023-03-10 | Disposition: A | Discharge status: Home / Self Care | Payer: Self-pay | Attending: Urgent Care | Admitting: Urgent Care

## 2023-03-10 DIAGNOSIS — S61244A Puncture wound with foreign body of right ring finger without damage to nail, initial encounter: Secondary | ICD-10-CM

## 2023-03-10 DIAGNOSIS — M79644 Pain in right finger(s): Secondary | ICD-10-CM

## 2023-03-10 DIAGNOSIS — Z23 Encounter for immunization: Secondary | ICD-10-CM

## 2023-03-10 MED ORDER — BACITRACIN ZINC 500 UNIT/GM EX OINT: 1.0000 | TOPICAL_OINTMENT | Freq: Three times a day (TID) | CUTANEOUS | 0 refills | Status: AC

## 2023-03-10 MED ORDER — IBUPROFEN 600 MG PO TABS: 600.0000 mg | ORAL_TABLET | Freq: Four times a day (QID) | ORAL | 0 refills | Status: AC | PRN

## 2023-03-10 MED ORDER — TETANUS-DIPHTH-ACELL PERTUSSIS 5-2.5-18.5 LF-MCG/0.5 IM SUSY
0.5000 mL | PREFILLED_SYRINGE | Freq: Once | INTRAMUSCULAR | Status: AC
  Administered 2023-03-10: 0.5 mL via INTRAMUSCULAR

## 2023-03-10 NOTE — ED Provider Notes (Addendum)
[facility]  Note:  This document was prepared using Conservation officer, historic buildings and may include unintentional dictation errors.  MRN: [id number] DOB: 1996-04-18  Subjective:   Cody Potter is a 27 y.o. male presenting for right ring finger injury today.  There was a hook in a blanket that they didn't know about. They were trying to get the blanket to use during fishing and that's when the hook snagged. Unfortunately, patient had a fishhook stuck into the finger pad of the right finger.  Needs to have his Tdap updated as to use to really dirty and rusty wire cutter to try to cut the hook.   No current facility-administered medications for this encounter. No current outpatient medications on file.   No Known Allergies  History reviewed. No pertinent past medical history.   History reviewed. No pertinent surgical history.  Family History  Problem Relation Age of Onset   Cancer Other    Diabetes Other    Heart failure Other    Hyperlipidemia Other     Social History   Tobacco Use   Smoking status: Some Days    Types: Cigarettes   Smokeless tobacco: Never  Vaping Use   Vaping Use: Every day  Substance Use Topics   Alcohol use: Yes    Comment: Socially   Drug use: Yes    Frequency: 3.0 times per week    ROS   Objective:   Vitals: BP 127/81 (BP Location: Right Arm)   Pulse 96   Temp 98.4 F (36.9 C) (Oral)   Resp 18   SpO2 96%   Physical Exam Constitutional:      General: He is not in acute distress.    Appearance: Normal appearance. He is well-developed and normal weight. He is not ill-appearing, toxic-appearing or diaphoretic.  HENT:     Head: Normocephalic and atraumatic.     Right Ear: External ear normal.     Left Ear: External ear normal.     Nose: Nose normal.     Mouth/Throat:     Pharynx: Oropharynx is clear.  Eyes:     General: No scleral icterus.       Right eye: No discharge.        Left eye: No discharge.      Extraocular Movements: Extraocular movements intact.  Cardiovascular:     Rate and Rhythm: Normal rate.  Pulmonary:     Effort: Pulmonary effort is normal.  Musculoskeletal:       Hands:     Cervical back: Normal range of motion.  Neurological:     Mental Status: He is alert and oriented to person, place, and time.  Psychiatric:        Mood and Affect: Mood normal.        Behavior: Behavior normal.        Thought Content: Thought content normal.        Judgment: Judgment normal.     Tdap updated in clinic.  Foreign body removal: Verbal consent obtained.  Sterile prep using iodine swabs, alcohol swabs.  Digital block performed using 2% lidocaine with epinephrine and an amount of 3cc. Pliers were used to cut the hook.  Mosquito forceps were used to withdraw the end of the hook through the finger pad.  This was done successfully, patient tolerated the procedure well.  Finger was cleansed and soaked.  Pressure dressing applied.  Assessment and Plan :   PDMP not reviewed this encounter.  1. Puncture wound of right ring finger with foreign body   2. Need for Tdap vaccination   3. Finger pain, right    Foreign body removed successfully.  Will defer antibiotic prophylaxis for now but have a low threshold for this should signs of infection develop over the next 48 to 72 hours.  Recommend wound care at home.  Ibuprofen for pain and inflammation.  Counseled patient on potential for adverse effects with medications prescribed/recommended today, ER and return-to-clinic precautions discussed, patient verbalized understanding.     Wallis Bamberg, [place] 03/10/23 1610

## 2023-03-10 NOTE — ED Triage Notes (Signed)
Pt reports he has a hook stuck in the right ring finger. Reports pain, numbness and tingling in the right ring finger.  Pt reports last Tdap was 7 years ago.

## 2023-03-10 NOTE — Discharge Instructions (Addendum)
Please do warm soaks for the finger 3-5 times daily using warm soapy water. Can do this 10 minutes at a time. Pat your wound dry, and keep it dry after you do your soak for about 30 minutes.  Thereafter make sure that you cover the wound with a nonstick dressing.  You can use bacitracin antibiotic ointment within the dressing.   If you develop redness, drainage of pus or bleeding, fever, swelling then please return to our clinic for recheck.

## 2023-06-14 ENCOUNTER — Ambulatory Visit (HOSPITAL_COMMUNITY)
Admission: EM | Admit: 2023-06-14 | Discharge: 2023-06-14 | Disposition: A | Discharge status: Home / Self Care | Payer: Self-pay | Attending: Sports Medicine | Admitting: Sports Medicine

## 2023-06-14 ENCOUNTER — Encounter (HOSPITAL_COMMUNITY): Payer: Self-pay

## 2023-06-14 ENCOUNTER — Ambulatory Visit (INDEPENDENT_AMBULATORY_CARE_PROVIDER_SITE_OTHER): Payer: Self-pay

## 2023-06-14 DIAGNOSIS — S4991XA Unspecified injury of right shoulder and upper arm, initial encounter: Secondary | ICD-10-CM

## 2023-06-14 DIAGNOSIS — M79671 Pain in right foot: Secondary | ICD-10-CM

## 2023-06-14 NOTE — ED Provider Notes (Signed)
[facility]    CSN: [id number] Arrival date & time: 06/14/23  4098      History   Chief Complaint Right Foot Pain   HPI Cody Potter is a 27 y.o. male here for evaluation of right foot pain.  He states he first noticed the pain getting out of bed 5 days ago.  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  He locates the pain to the medial midfoot and endorses positive redness and swelling.  Pain worse with weightbearing and plantarflexion.  He denies any prior injuries to this foot or ankle and no surgeries.  No significant medical history specifically denies history of gout.  He has tried ibuprofen which helps marginally.  He also notes pain on the anterior aspect of his right shoulder this morning.  Again no mechanism of injury though states that when he woke up he was having achy pain in the front of the shoulder.  Pain is worse when he crosses his arm across his body.  He is a former Automotive engineer and states he has had some short term injuries to his shoulders previously though no prior dislocations or AC joint separations that he is aware of.  Denies radicular symptoms or weakness down the arm.  History reviewed. No pertinent past medical history.  There are no problems to display for this patient.  History reviewed. No pertinent surgical history.   Home Medications    Prior to Admission medications   Medication Sig Start Date End Date Taking? Authorizing Provider  bacitracin ointment Apply 1 Application topically 3 (three) times daily. 03/10/23  Yes Wallis Bamberg, PA-C  ibuprofen (ADVIL) 600 MG tablet Take 1 tablet (600 mg total) by mouth every 6 (six) hours as needed. 03/10/23  Yes Wallis Bamberg, PA-C  dicyclomine (BENTYL) 20 MG tablet Take 1 tablet (20 mg total) by mouth 2 (two) times daily. Patient not taking: Reported on 12/09/2019 04/17/18 12/09/19  Belinda Fisher, PA-C   Family  History Family History  Problem Relation Age of Onset   Cancer Other    Diabetes Other    Heart failure Other    Hyperlipidemia Other     Social History Social History   Tobacco Use   Smoking status: Some Days    Types: Cigarettes   Smokeless tobacco: Never  Vaping Use   Vaping status: Every Day  Substance Use Topics   Alcohol use: Yes    Comment: Socially   Drug use: Yes    Frequency: 3.0 times per week     Allergies   Patient has no known allergies.   Review of Systems Review of Systems  Constitutional:  Negative for fever.  Musculoskeletal:  Positive for joint swelling.  Skin:  Positive for color change. Negative for rash and wound.     Physical Exam Vital Signs BP 119/80 (BP Location: Left Arm)   Pulse 66   Temp 97.9 F (36.6 C) (Oral)   Resp 16   SpO2 98%   Physical Exam General: Alert, well-appearing male in no acute distress Respiratory: Breathing unlabored, symmetric chest rise  Right Foot MSK Exam: Visible erythema, bruising, and swelling noted over the medial midfoot -bruising tracks proximally behind medial malleolus.  Forefoot and ankle appears normal. FROM TTP over navicular cuboid joint, no laxity of the joint with manipulation.  Tender to palpation along the medial midfoot and hindfoot.  No tenderness to palpation along the medial malleolus, lateral malleolus, plantar calcaneus, base of the fifth metatarsal, dorsal metatarsal surfaces or MTP joints. Pain with resisted plantarflexion and inversion. Negative ant drawer and negative talar tilt.   Negative syndesmotic compression. Thompsons test negative. NV intact distally.  Right shoulder MSK exam: No swelling, ecchymoses. No gross deformity. TTP over [facility] joint, otherwise no TTP. FROM. Negative Hawkins, Neers. Negative Yergasons. Strength 5/5 with empty can and resisted internal/external rotation. + Cross arm and chuck norris test. Negative apprehension. NV intact distally.  UC  Treatments / Results  Labs (all labs ordered are listed, but only abnormal results are displayed) Labs Reviewed - No data to display  EKG  Radiology DG Foot Complete Right  Result Date: 06/14/2023 CLINICAL DATA:  Acute pain over the navicular cuboid joint with overlying soft tissue swelling for 5 days. No known injury. EXAM: RIGHT FOOT COMPLETE - 3+ VIEW COMPARISON:  None Available. FINDINGS: The mineralization and alignment are normal. There is no evidence of acute fracture or dislocation. The joint spaces appear preserved. No radiographic evidence of tarsal coalition. Possible small incidental bone island inferiorly in the calcaneal body. Tiny foreign body projects superficial to the skin of the great toe on the oblique view, likely within the patient's clothing. The soft tissues appear unremarkable. IMPRESSION: No acute osseous findings or significant arthropathic changes. Electronically Signed   By: Carey Bullocks M.D.   On: 06/14/2023 10:54    Procedures Procedures (including critical care time)  Medications Ordered in UC Medications - No data to display  Initial Impression / Assessment and Plan / UC Course  I have reviewed the triage vital signs and the nursing notes.  Pertinent labs & imaging results that were available during my care of the patient were reviewed by me and considered in my medical decision making (see chart for details).    Interesting presentation of right midfoot pain with swelling and bruising.  3 view X-rays of the right foot personally reviewed and show no acute osseous abnormalities or alignment abnormalities - agree with radiology read.  Given his exam findings and normal x-ray I am suspicious of a posterior tibialis tendon tear, less likely navicular stress fracture or Mueller Weiss syndrome.  We do not have any short cam walking boot so he was placed in an ASO brace and recommended follow-up with sports medicine office, referral placed.  I think he would  benefit from ultrasound examination of the medial midfoot to evaluate his posterior tibialis tendon and navicular cuneiform joint.  If there is evidence of posterior tibialis tendon tear would likely benefit from transition to short cam boot.  I recommended he relatively rest and limit weightbearing until this evaluation and take ibuprofen 600 mg 3 times daily and ice as needed.  Patient's questions were answered and he is in agreement with this plan.  Regarding his right shoulder his exam is consistent with AC joint pain, without mechanism of injury unlikely to be significant damage.  Recommended he take the ibuprofen as described above and if pain is persisting we can evaluate this in the sports medicine office as well.  Final Clinical Impressions(s) / UC Diagnoses   Final diagnoses:  Right foot pain  Injury of right acromioclavicular joint, initial encounter     Discharge Instructions      I think you are dealing with tendonitis or tearing of the posterior tibialis tendon in the right foot. These are typically immobilized in a short CAM boot however we  were out of these so use the ankle ASO brace and limit your activity/weight-bearing until your follow-up with the [facility] Center. I would also like for you to take Ibuprofen 600mg  (3 tablets) three times daily for the next few weeks and can ice the foot if swelling persists.  If you haven't heard from the [facility] Medicine Center in the next week please call to schedule the appointment.  [facility] Address: [address] [place], [place], [place] [address] Phone: [phone]   ED Prescriptions   None    PDMP not reviewed this encounter.   Marisa Cyphers, MD 06/14/23 1140

## 2023-06-14 NOTE — Discharge Instructions (Addendum)
I think you are dealing with tendonitis or tearing of the posterior tibialis tendon in the right foot. These are typically immobilized in a short CAM boot however we were out of these so use the ankle ASO brace and limit your activity/weight-bearing until your follow-up with the [facility] Medicine Center. I would also like for you to take Ibuprofen 600mg  (3 tablets) three times daily for the next few weeks and can ice the foot if swelling persists.  If you haven't heard from the [facility] in the next week please call to schedule the appointment.  [facility] Address: [address] [place], [place], [place] [address] Phone: [phone]

## 2023-06-14 NOTE — ED Triage Notes (Signed)
Pt presents with L-foot pain and swelling x 5 days. Pt denies any recent injuries.

## 2023-07-19 ENCOUNTER — Ambulatory Visit: Payer: Self-pay | Admitting: Sports Medicine
